# Patient Record
Sex: Male | Born: 2000 | Race: White | Hispanic: No | Marital: Single | Smoking: Never smoker
Health system: Southern US, Community
[De-identification: ages and names within clinical notes are randomized; demographics above are authoritative.]

## PROBLEM LIST (undated history)

## (undated) DIAGNOSIS — J45909 Unspecified asthma, uncomplicated: Secondary | ICD-10-CM

## (undated) HISTORY — DX: Unspecified asthma, uncomplicated: J45.909

---
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
## 2022-01-17 DIAGNOSIS — M9905 Segmental and somatic dysfunction of pelvic region: Secondary | ICD-10-CM | POA: Diagnosis not present

## 2022-01-17 DIAGNOSIS — M9904 Segmental and somatic dysfunction of sacral region: Secondary | ICD-10-CM | POA: Diagnosis not present

## 2022-01-17 DIAGNOSIS — M7918 Myalgia, other site: Secondary | ICD-10-CM | POA: Diagnosis not present

## 2022-01-17 DIAGNOSIS — M9903 Segmental and somatic dysfunction of lumbar region: Secondary | ICD-10-CM | POA: Diagnosis not present

## 2022-01-25 ENCOUNTER — Telehealth: Payer: Self-pay

## 2022-01-25 NOTE — Telephone Encounter (Signed)
Last OV 04/08/21. Ok to work in?

## 2022-01-25 NOTE — Telephone Encounter (Signed)
States patient needs to have sports CPE for college by July.  Would like to know if Dr. Yong Channel could work patient in to be seen?

## 2022-01-26 NOTE — Telephone Encounter (Signed)
I have actually done a sports physical on him at last physical last august- if college will accept I could actually use last august physical  If we schedule him for a physical in July- insurance will not cover unless he switched to a new insurance since [employer]

## 2022-01-27 NOTE — Telephone Encounter (Signed)
FYI

## 2022-01-27 NOTE — Telephone Encounter (Signed)
Mom states that since patient is transitioning into [employer], they will not accept last years sports physical.  Needs forms completed for [employer].  States if insurance will not cover, she is ok with paying out of pocket.  Please advise.

## 2022-01-27 NOTE — Telephone Encounter (Signed)
See below regarding July scheduling/insurance.

## 2022-01-27 NOTE — Telephone Encounter (Signed)
Can work into same day slot

## 2022-01-28 NOTE — Telephone Encounter (Signed)
See below

## 2022-02-07 ENCOUNTER — Ambulatory Visit (INDEPENDENT_AMBULATORY_CARE_PROVIDER_SITE_OTHER): Payer: Self-pay | Admitting: Family Medicine

## 2022-02-07 ENCOUNTER — Encounter: Payer: Self-pay | Admitting: Family Medicine

## 2022-02-07 VITALS — BP 110/64 | HR 60 | Temp 98.6°F | Ht 74.0 in | Wt 197.0 lb

## 2022-02-07 DIAGNOSIS — Z114 Encounter for screening for human immunodeficiency virus [HIV]: Secondary | ICD-10-CM | POA: Diagnosis not present

## 2022-02-07 DIAGNOSIS — Z130 Encounter for screening for diseases of the blood and blood-forming organs and certain disorders involving the immune mechanism: Secondary | ICD-10-CM

## 2022-02-07 DIAGNOSIS — Z1159 Encounter for screening for other viral diseases: Secondary | ICD-10-CM | POA: Diagnosis not present

## 2022-02-07 DIAGNOSIS — Z025 Encounter for examination for participation in sport: Secondary | ICD-10-CM

## 2022-02-07 NOTE — Patient Instructions (Addendum)
Team he has had HPV series- please update from [place] with your family about Bexsero (meningitis B vaccination)- you wanted to hold off until chatting with them- not required for college  We did one time lifetime screening Hep C and HIV per national recommendations- you wanted to hold on other bloodwork including cholesterol for now  Please stop by lab before you go  If you have mychart- we will send your results within 3 business days of [place] receiving them.  If you do not have mychart- we will call you about results within 5 business days of [place] receiving them.  *please also note that you will see labs on mychart as soon as they post. I will later go in and write notes on them- will say "notes from Dr. Yong Channel"   Recommended follow up: Return in about 1 year (around 02/08/2023) for physical or sooner if needed.Schedule b4 you leave.

## 2022-02-07 NOTE — Progress Notes (Signed)
  Phone [phone] In person visit   Subjective:   Johnny Hendricks is a 21 y.o. year old very pleasant male patient who presents for/with See problem oriented charting Chief Complaint  Patient presents with   [facility]    [employer] forms   Past Medical History-  Patient Active Problem List   Diagnosis Date Noted   Childhood asthma 04/08/2021    Priority: Low   Medications- reviewed and updated No current outpatient medications on file.   No current facility-administered medications for this visit.     Objective:  BP 110/64   Pulse 60   Temp 98.6 F (37 C)   Ht '6\' 2"'$  (1.88 m)   Wt 197 lb (89.4 kg)   SpO2 98%   BMI 25.29 kg/m  Gen: NAD, resting comfortably CV: RRR no murmurs rubs or gallops Lungs: CTAB no crackles, wheeze, rhonchi Abdomen: soft/nontender/nondistended/normal bowel sounds. No rebound or guarding.  Ext: no edema Skin: warm, dry Neuro: grossly normal, moves all extremities Sports physical items completed- normal msk exam. No hernia    Assessment and Plan   Sports physical today with [employer] form completion -needs sickle cell testing  Also see after visit summary  Recommended follow up: Return in about 1 year (around 02/08/2023) for physical or sooner if needed.Schedule b4 you leave.  Lab/Order associations:   ICD-10-CM   1. Routine sports examination  Z02.5     2. Encounter for hepatitis C screening test for low risk patient  Z11.59 Hepatitis C antibody    3. Screening for HIV (human immunodeficiency virus)  Z11.4 HIV Antibody (routine testing w rflx)    4. Encounter for sickle-cell screening  Z13.0 Sickle cell screen     Return precautions advised.  Garret Reddish, MD

## 2022-02-08 LAB — SICKLE CELL SCREEN: Sickle Solubility Test - HGBRFX: NEGATIVE

## 2022-02-08 LAB — HEPATITIS C ANTIBODY
Hepatitis C Ab: NONREACTIVE
SIGNAL TO CUT-OFF: 0.09 (ref <1.00)

## 2022-02-08 LAB — HIV ANTIBODY (ROUTINE TESTING W REFLEX): HIV 1&2 Ab, 4th Generation: NONREACTIVE

## 2022-02-21 DIAGNOSIS — M9904 Segmental and somatic dysfunction of sacral region: Secondary | ICD-10-CM | POA: Diagnosis not present

## 2022-02-21 DIAGNOSIS — M9903 Segmental and somatic dysfunction of lumbar region: Secondary | ICD-10-CM | POA: Diagnosis not present

## 2022-02-21 DIAGNOSIS — M7918 Myalgia, other site: Secondary | ICD-10-CM | POA: Diagnosis not present

## 2022-02-21 DIAGNOSIS — M9905 Segmental and somatic dysfunction of pelvic region: Secondary | ICD-10-CM | POA: Diagnosis not present

## 2022-04-05 DIAGNOSIS — M7918 Myalgia, other site: Secondary | ICD-10-CM | POA: Diagnosis not present

## 2022-04-05 DIAGNOSIS — M9905 Segmental and somatic dysfunction of pelvic region: Secondary | ICD-10-CM | POA: Diagnosis not present

## 2022-04-05 DIAGNOSIS — M9903 Segmental and somatic dysfunction of lumbar region: Secondary | ICD-10-CM | POA: Diagnosis not present

## 2022-04-05 DIAGNOSIS — M9904 Segmental and somatic dysfunction of sacral region: Secondary | ICD-10-CM | POA: Diagnosis not present

## 2022-04-12 DIAGNOSIS — M9904 Segmental and somatic dysfunction of sacral region: Secondary | ICD-10-CM | POA: Diagnosis not present

## 2022-04-12 DIAGNOSIS — M9905 Segmental and somatic dysfunction of pelvic region: Secondary | ICD-10-CM | POA: Diagnosis not present

## 2022-04-12 DIAGNOSIS — M7918 Myalgia, other site: Secondary | ICD-10-CM | POA: Diagnosis not present

## 2022-04-12 DIAGNOSIS — M9903 Segmental and somatic dysfunction of lumbar region: Secondary | ICD-10-CM | POA: Diagnosis not present

## 2022-04-19 DIAGNOSIS — M9904 Segmental and somatic dysfunction of sacral region: Secondary | ICD-10-CM | POA: Diagnosis not present

## 2022-04-19 DIAGNOSIS — M7918 Myalgia, other site: Secondary | ICD-10-CM | POA: Diagnosis not present

## 2022-04-19 DIAGNOSIS — M9905 Segmental and somatic dysfunction of pelvic region: Secondary | ICD-10-CM | POA: Diagnosis not present

## 2022-04-19 DIAGNOSIS — M9903 Segmental and somatic dysfunction of lumbar region: Secondary | ICD-10-CM | POA: Diagnosis not present

## 2022-05-17 DIAGNOSIS — M9902 Segmental and somatic dysfunction of thoracic region: Secondary | ICD-10-CM | POA: Diagnosis not present

## 2022-05-17 DIAGNOSIS — M9903 Segmental and somatic dysfunction of lumbar region: Secondary | ICD-10-CM | POA: Diagnosis not present

## 2022-05-17 DIAGNOSIS — M9901 Segmental and somatic dysfunction of cervical region: Secondary | ICD-10-CM | POA: Diagnosis not present

## 2022-05-31 DIAGNOSIS — M9903 Segmental and somatic dysfunction of lumbar region: Secondary | ICD-10-CM | POA: Diagnosis not present

## 2022-05-31 DIAGNOSIS — M9902 Segmental and somatic dysfunction of thoracic region: Secondary | ICD-10-CM | POA: Diagnosis not present

## 2022-05-31 DIAGNOSIS — M9901 Segmental and somatic dysfunction of cervical region: Secondary | ICD-10-CM | POA: Diagnosis not present

## 2022-06-06 DIAGNOSIS — L700 Acne vulgaris: Secondary | ICD-10-CM | POA: Diagnosis not present

## 2022-06-06 DIAGNOSIS — M9903 Segmental and somatic dysfunction of lumbar region: Secondary | ICD-10-CM | POA: Diagnosis not present

## 2022-06-06 DIAGNOSIS — M9904 Segmental and somatic dysfunction of sacral region: Secondary | ICD-10-CM | POA: Diagnosis not present

## 2022-06-06 DIAGNOSIS — M7918 Myalgia, other site: Secondary | ICD-10-CM | POA: Diagnosis not present

## 2022-06-06 DIAGNOSIS — M9905 Segmental and somatic dysfunction of pelvic region: Secondary | ICD-10-CM | POA: Diagnosis not present

## 2022-06-06 DIAGNOSIS — L739 Follicular disorder, unspecified: Secondary | ICD-10-CM | POA: Diagnosis not present

## 2022-06-14 DIAGNOSIS — M9903 Segmental and somatic dysfunction of lumbar region: Secondary | ICD-10-CM | POA: Diagnosis not present

## 2022-06-14 DIAGNOSIS — M9901 Segmental and somatic dysfunction of cervical region: Secondary | ICD-10-CM | POA: Diagnosis not present

## 2022-06-14 DIAGNOSIS — M9902 Segmental and somatic dysfunction of thoracic region: Secondary | ICD-10-CM | POA: Diagnosis not present

## 2022-06-21 DIAGNOSIS — M9901 Segmental and somatic dysfunction of cervical region: Secondary | ICD-10-CM | POA: Diagnosis not present

## 2022-06-21 DIAGNOSIS — M9902 Segmental and somatic dysfunction of thoracic region: Secondary | ICD-10-CM | POA: Diagnosis not present

## 2022-06-21 DIAGNOSIS — M9903 Segmental and somatic dysfunction of lumbar region: Secondary | ICD-10-CM | POA: Diagnosis not present

## 2022-07-05 DIAGNOSIS — M9903 Segmental and somatic dysfunction of lumbar region: Secondary | ICD-10-CM | POA: Diagnosis not present

## 2022-07-05 DIAGNOSIS — M9901 Segmental and somatic dysfunction of cervical region: Secondary | ICD-10-CM | POA: Diagnosis not present

## 2022-07-05 DIAGNOSIS — M9902 Segmental and somatic dysfunction of thoracic region: Secondary | ICD-10-CM | POA: Diagnosis not present

## 2022-07-20 DIAGNOSIS — M9905 Segmental and somatic dysfunction of pelvic region: Secondary | ICD-10-CM | POA: Diagnosis not present

## 2022-07-20 DIAGNOSIS — M7918 Myalgia, other site: Secondary | ICD-10-CM | POA: Diagnosis not present

## 2022-07-20 DIAGNOSIS — M9904 Segmental and somatic dysfunction of sacral region: Secondary | ICD-10-CM | POA: Diagnosis not present

## 2022-07-20 DIAGNOSIS — M9903 Segmental and somatic dysfunction of lumbar region: Secondary | ICD-10-CM | POA: Diagnosis not present

## 2022-08-16 DIAGNOSIS — M9903 Segmental and somatic dysfunction of lumbar region: Secondary | ICD-10-CM | POA: Diagnosis not present

## 2022-08-16 DIAGNOSIS — M9905 Segmental and somatic dysfunction of pelvic region: Secondary | ICD-10-CM | POA: Diagnosis not present

## 2022-08-16 DIAGNOSIS — M7918 Myalgia, other site: Secondary | ICD-10-CM | POA: Diagnosis not present

## 2022-08-16 DIAGNOSIS — M9904 Segmental and somatic dysfunction of sacral region: Secondary | ICD-10-CM | POA: Diagnosis not present

## 2022-08-26 DIAGNOSIS — M9905 Segmental and somatic dysfunction of pelvic region: Secondary | ICD-10-CM | POA: Diagnosis not present

## 2022-08-26 DIAGNOSIS — M9903 Segmental and somatic dysfunction of lumbar region: Secondary | ICD-10-CM | POA: Diagnosis not present

## 2022-08-26 DIAGNOSIS — M9904 Segmental and somatic dysfunction of sacral region: Secondary | ICD-10-CM | POA: Diagnosis not present

## 2022-08-26 DIAGNOSIS — M7918 Myalgia, other site: Secondary | ICD-10-CM | POA: Diagnosis not present

## 2022-08-30 ENCOUNTER — Telehealth: Payer: Self-pay | Admitting: Family Medicine

## 2022-08-30 NOTE — Telephone Encounter (Signed)
Noted  

## 2022-08-30 NOTE — Telephone Encounter (Signed)
Patient states: -Has been having left testicular pain for a few days  - Pain comes and goes but never increased in terms of intensity  - Pain rated 3-4/10 - Feels like a lump may be forming in area   Patient has been transferred to triage.

## 2022-08-30 NOTE — Telephone Encounter (Signed)
Patient has an apt with Dr Randol Kern 08/31/22 at 920 am    patient Name: Johnny Saxon PA [facility] Gender: Male DOB: 01-19-2001 Age: 22 Y 47 M 25 D Return Phone Number: [phone] (Primary), [phone] (Secondary) Address: City/ State/ Zip: [place]  [address] Client [place] [facility] Client Site [place] [facility] Provider Garret Reddish- MD Contact Type Call Who Is Calling Patient / Member / Family / Caregiver Call Type Triage / Clinical Relationship To Patient Self Return Phone Number [phone] (Primary) Chief Complaint Skin Lesion - Moles/ Lumps/ Growths Reason for Call Symptomatic / Request for [name] Nurse Assessment Nurse: Alvis Lemmings, RN, Marcie Bal Date/Time Eilene Ghazi Time): 08/30/2022 9:37:26 AM Confirm and document reason for call. If symptomatic, describe symptoms. ---Caller states he is experiencing testicle pain on his left side and believe a lump is developing. Aching on and off, top of left testicle,felt a small lump on top. Tender to touch. No fever. Does the patient have any new or worsening symptoms? ---Yes Will a triage be completed? ---Yes Related visit to physician within the last 2 weeks? ---No Does the PT have any chronic conditions? (i.e. diabetes, asthma, this includes High risk factors for pregnancy, etc.) ---No Is this a behavioral health or substance abuse call? ---No Guidelines Guideline Title Affirmed Question Affirmed Notes Nurse Date/Time (Eastern Time) Scrotum Pain [1] Pain comes and goes (intermittent) AND [2] present > 24 hours Etter Sjogren 08/30/2022 9:39:08 AM Disp. Time Eilene Ghazi Time) Disposition Final User 08/30/2022 9:40:44 AM See PCP within 24 Hours Yes Alvis Lemmings, RN, Marcie Bal PLEASE NOTE: All timestamps contained within this report  are represented as [place] Standard Time. CONFIDENTIALTY NOTICE: This fax transmission is intended only for the addressee. It contains information that is legally privileged, confidential or otherwise protected from use or disclosure. If you are not the intended recipient, you are strictly prohibited from reviewing, disclosing, copying using or disseminating any of this information or taking any action in reliance on or regarding this information. If you have received this fax in error, please notify us immediately by telephone so that we can arrange for its return to [place]. Phone: [phone], Toll-Free: [phone], Fax: [phone] Page: 2 of 2 Call Id: [id number] Final Disposition 08/30/2022 9:40:44 AM See PCP within 24 Hours Yes Alvis Lemmings, RN, Lenon Oms Disagree/Comply Comply Caller Understands Yes PreDisposition Call Doctor Care Advice Given Per Guideline SEE PCP WITHIN 24 HOURS: * IF OFFICE WILL BE OPEN: You need to be examined within the next 24 hours. Call your doctor (or NP/PA) when the office opens and make an appointment. PAIN MEDICINES: * For pain relief, you can take either acetaminophen, ibuprofen, or naproxen. * They are over-the-counter (OTC) pain drugs. You can buy them at the drugstore. * ACETAMINOPHEN - REGULAR STRENGTH TYLENOL: Take 650 mg (two 325 mg pills) by mouth every 4 to 6 hours as needed. Each Regular Strength Tylenol pill has 325 mg of acetaminophen. The most you should take is 10 pills a day (3,250 mg total). Note: In [place], the maximum is 12 pills a day (3,900 mg total). CARE ADVICE given per Scrotum Pain (Adult) guideline. CALL BACK IF: * Severe pain * Constant pain lasts over 1 hour * Swelling or redness occurs * Fever over 100.4 F (38.0 C) * You become worse Referrals REFERRED TO PCP OFFICE Warm transfer to  backline

## 2022-08-31 ENCOUNTER — Ambulatory Visit (INDEPENDENT_AMBULATORY_CARE_PROVIDER_SITE_OTHER): Payer: BC Managed Care – PPO | Admitting: Internal Medicine

## 2022-08-31 ENCOUNTER — Other Ambulatory Visit (HOSPITAL_COMMUNITY)
Admission: RE | Admit: 2022-08-31 | Discharge: 2022-08-31 | Disposition: A | Discharge status: Home / Self Care | Payer: BC Managed Care – PPO | Source: Ambulatory Visit | Attending: Internal Medicine | Admitting: Internal Medicine

## 2022-08-31 ENCOUNTER — Other Ambulatory Visit: Payer: Self-pay

## 2022-08-31 ENCOUNTER — Encounter: Payer: Self-pay | Admitting: Internal Medicine

## 2022-08-31 VITALS — BP 104/65 | HR 66 | Temp 97.8°F | Ht 74.07 in | Wt 200.2 lb

## 2022-08-31 DIAGNOSIS — N5089 Other specified disorders of the male genital organs: Secondary | ICD-10-CM | POA: Insufficient documentation

## 2022-08-31 DIAGNOSIS — N451 Epididymitis: Secondary | ICD-10-CM | POA: Diagnosis not present

## 2022-08-31 NOTE — Addendum Note (Signed)
Addended by: Joanette Gula on: 08/31/2022 10:41 AM   Modules accepted: Orders

## 2022-08-31 NOTE — Addendum Note (Signed)
Addended by: Doran Clay A on: 08/31/2022 10:30 AM   Modules accepted: Orders

## 2022-08-31 NOTE — Assessment & Plan Note (Addendum)
We entered in the information above and also prompted [name] artificial intelligence to analyze the case and it for the most part agreed with my personal clinical impression that this seems most likely to be epididymitis but testicular cancer does need ruled out.  Based on his reports it seems unlikely to be a sexually transmitted epididymitis.  Where he does extreme lifting its most likely to be some sort of traumatic epididymitis from trauma secondary to the lifting.   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 Approximately 10 days ago, they noticed discomfort and a slightly painful lump in the posterior superior region of the left testicle, which was first observed around August 17, 2022. The individual denies any recent sexual activity and has consistently used protection, making a sexually transmitted disease (STD) unlikely. Tenderness persists in the area, and there is also a report of slight pain in the inguinal region. There are no symptoms of dysuria, urinary frequency, or abnormalities in urine or semen. The patient's medication list, allergies, social history, physical examination findings, laboratory data, illness course, and imaging data are not provided in the summary.  Most Likely Dx:  Epididymitis is the most likely diagnosis. This condition is characterized by inflammation of the epididymis, which can cause pain, swelling, and tenderness in the scrotal area. The patient's symptoms of discomfort, a palpable lump, and tenderness in the testicular region, along with pain in the inguinal area, are consistent with this diagnosis. The presence of fever, pyuria, or a positive urine  culture could further suggest this diagnosis.  Expanded DDx:  Testicular Torsion: Testicular torsion could explain the acute onset of pain and swelling in the testicular region. This condition is a urological emergency and requires immediate surgical intervention. The presence of an absent cremasteric reflex or abnormal testicular lie could further suggest this diagnosis.  Varicocele: A varicocele could explain the lump and discomfort in the testicular region, particularly if the pain increases with physical activity or standing, as it is often associated with dilated veins in the spermatic cord. The presence of a "bag of worms" texture upon palpation of the spermatic cord could further suggest this diagnosis.  Alternative DDx:  Spermatocele: A spermatocele could explain the presence of a lump in the testicular region. It is a benign cystic accumulation of sperm that arises from the head of the epididymis. The presence of a transilluminating mass upon scrotal examination could further suggest this diagnosis.  Hydrocele: A hydrocele could explain the swelling and discomfort in the testicular region. It is characterized by fluid accumulation within the tunica vaginalis. The presence of a positive transillumination test could further suggest this diagnosis.  Inguinal Hernia: An inguinal hernia could explain the pain in the inguinal region and could potentially present as a lump near the testicle if part of the intestine or fatty tissue protrudes into the inguinal canal. The presence of a cough impulse or a lump that enlarges with straining could further suggest this diagnosis.  Orchitis: Orchitis, or inflammation of the testicle, could explain the testicular pain and swelling. It can occur in conjunction with epididymitis (epididymo-orchitis) or independently. The presence of systemic symptoms such as fever could further suggest this diagnosis.  Testicular Cancer: Although  less likely given the  patient's age and the acute presentation, testicular cancer could present as a lump in the testicle. The presence of a firm, non-tender mass that does not transilluminate could further suggest this diagnosis.  Trauma: Given the patient's history as an athlete involved in heavy lifting, direct trauma to the testicular region could explain the pain and swelling. The presence of bruising or a history of a specific traumatic event could further suggest this diagnosis.  #Testicular Mass  The 22 year old patient presents with a chief complaint of a slightly painful lump on the posterior superior aspect of the left testicle, first noticed approximately 10 days ago. The discomfort and pain in the region, along with the absence of sexual activity for months and consistent use of protection, make a sexually transmitted disease less likely. 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 The differential diagnosis includes testicular torsion, epididymitis, varicocele, hydrocele, spermatocele, and testicular cancer.  Dx: - Testicular ultrasound to assess for masses, blood flow, and structural abnormalities. - Urinalysis to rule out infection or urinary tract involvement. - Complete blood count (CBC) and inflammatory markers such as C-reactive protein (CRP) or erythrocyte sedimentation rate (ESR) to evaluate for signs of infection or inflammation. - Tumor markers (alpha-fetoprotein (AFP), human chorionic gonadotropin (hCG), and lactate dehydrogenase (LDH)) if a testicular tumor is suspected.  Tx: - For testicular torsion, immediate urological consultation for potential surgical intervention. - For epididymitis, antibiotics tailored to the likely causative organisms. - For varicocele, conservative management with scrotal support or surgical repair if symptomatic. - For hydrocele,  observation or surgical repair if symptomatic or large. - For spermatocele, conservative management or surgical excision if symptomatic. - For testicular cancer, referral to a urologist and oncologist for staging and management, which may include orchiectomy, chemotherapy, and/or radiation therapy depending on the stage and type of cancer.  I reviewed all the above with patient and advised he complete the std testing despite the low risk and the stat ultrasound and arranged that with our refer coordinator.  I advised him not to do anything to strain the area (no exercise) until we get this done.   Decided against a lot of the artificial intelligence suggested blood work that I felt would not impact management yet- I feel like we need to confirm what the lump is with ultrasound and sexual transmitted infection and UA initially and asked him to remain available until that is all done.  I shared this encounter  back to his Primary Care Provider (PCP) to coordinate care.

## 2022-08-31 NOTE — Addendum Note (Signed)
Addended by: Joanette Gula on: 08/31/2022 10:27 AM   Modules accepted: Orders

## 2022-08-31 NOTE — Telephone Encounter (Signed)
Actually, that was another patient. Disregard that.

## 2022-08-31 NOTE — Progress Notes (Signed)
[facility]: [phone]   Routine Medical Office Visit  Patient:  Johnny Hendricks      Age: 22 y.o.       Sex:  male  Date:   08/31/2022  PCP:    Johnny Hendricks, [place] Provider: Loralee Pacas, MD  Assessment/Plan:   Johnny Hendricks was seen today for left testicle pain/lump.  Epididymitis -     Cytology (oral, anal, urethral) ancillary only -     RPR -     HIV Antibody (routine testing w rflx); Future -     Herpes simplex virus culture -     US PELVIS (TRANSABDOMINAL ONLY); Future  Testicle lump Overview: He reports he noticed some swelling and a lump on his left testicle about August 17, 2022 after investigating it for discomfort and pain in the region.  He had not had any sex for months prior and wears protection when he does so feels the STD very unlikely.  He still has tenderness in the area.  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  Denies any dysuria urinary frequency abnormalities of the urine or semen.  On physical exam his cremasteric reflex is intact and there is a swelling with amorphous soft irregularity on the superior posterior testicle there is no abnormality of the skin in the area  Assessment & Plan: We entered in the information above and also prompted Johnny Hendricks artificial intelligence to analyze the case and it for the most part agreed with my personal clinical impression that this seems most likely to be epididymitis but testicular cancer does need ruled out and based on his reports it seems unlikely to be a sexually transmitted epididymitis.  Where he does extreme lifting I have to also be concern for epididymitis from trauma secondary to the lifting.   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 Approximately 10 days ago, they noticed discomfort and a slightly painful lump in the posterior superior region of the left testicle, which was first observed around August 17, 2022. The individual denies any recent sexual activity and has consistently used protection, making a sexually transmitted disease (STD) unlikely. Tenderness persists in the area, and there is also a report of slight pain in the inguinal region. There are no symptoms of dysuria, urinary frequency, or abnormalities in urine or semen. The patient's medication list, allergies, social history, physical examination findings, laboratory data, illness course, and imaging data are not provided in the summary.  Most Likely Dx:  Epididymitis is the most likely diagnosis. This condition is characterized by inflammation of the epididymis, which can cause pain, swelling, and tenderness in the scrotal area. The patient's symptoms of discomfort, a palpable lump, and tenderness in the testicular region, along with pain in the inguinal area, are consistent with this diagnosis. The presence of fever, pyuria, or a positive urine culture could further suggest this diagnosis.  Expanded DDx:  Testicular Torsion: Testicular torsion could explain the acute onset of pain and swelling in the testicular region. This condition is a urological emergency and requires immediate surgical intervention. The presence  of an absent cremasteric reflex or abnormal testicular lie could further suggest this diagnosis.  Varicocele: A varicocele could explain the lump and discomfort in the testicular region, particularly if the pain increases with physical activity or standing, as it is often associated with dilated veins in the spermatic cord. The presence of a "bag of worms" texture upon palpation of the  spermatic cord could further suggest this diagnosis.  Alternative DDx:  Spermatocele: A spermatocele could explain the presence of a lump in the testicular region. It is a benign cystic accumulation of sperm that arises from the head of the epididymis. The presence of a transilluminating mass upon scrotal examination could further suggest this diagnosis.  Hydrocele: A hydrocele could explain the swelling and discomfort in the testicular region. It is characterized by fluid accumulation within the tunica vaginalis. The presence of a positive transillumination test could further suggest this diagnosis.  Inguinal Hernia: An inguinal hernia could explain the pain in the inguinal region and could potentially present as a lump near the testicle if part of the intestine or fatty tissue protrudes into the inguinal canal. The presence of a cough impulse or a lump that enlarges with straining could further suggest this diagnosis.  Orchitis: Orchitis, or inflammation of the testicle, could explain the testicular pain and swelling. It can occur in conjunction with epididymitis (epididymo-orchitis) or independently. The presence of systemic symptoms such as fever could further suggest this diagnosis.  Testicular Cancer: Although less likely given the patient's age and the acute presentation, testicular cancer could present as a lump in the testicle. The presence of a firm, non-tender mass that does not transilluminate could further suggest this diagnosis.  Trauma: Given the patient's history as an athlete involved in heavy lifting, direct trauma to the testicular region could explain the pain and swelling. The presence of bruising or a history of a specific traumatic event could further suggest this diagnosis.  #Testicular Mass  The 22 year old patient presents with a chief complaint of a slightly painful lump on the posterior superior aspect of the left testicle, first noticed approximately 10 days ago. The  discomfort and pain in the region, along with the absence of sexual activity for months and consistent use of protection, make a sexually transmitted disease less likely. 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 The differential diagnosis includes testicular torsion, epididymitis, varicocele, hydrocele, spermatocele, and testicular cancer.  Dx: - Testicular ultrasound to assess for masses, blood flow, and structural abnormalities. - Urinalysis to rule out infection or urinary tract involvement. - Complete blood count (CBC) and inflammatory markers such as C-reactive protein (CRP) or erythrocyte sedimentation rate (ESR) to evaluate for signs of infection or inflammation. - Tumor markers (alpha-fetoprotein (AFP), human chorionic gonadotropin (hCG), and lactate dehydrogenase (LDH)) if a testicular tumor is suspected.  Tx: - For testicular torsion, immediate urological consultation for potential surgical intervention. - For epididymitis, antibiotics tailored to the likely causative organisms. - For varicocele, conservative management with scrotal support or surgical repair if symptomatic. - For hydrocele, observation or surgical repair if symptomatic or large. - For spermatocele, conservative management or surgical excision if symptomatic. - For testicular cancer, referral to a urologist and oncologist for staging and management, which may include orchiectomy, chemotherapy, and/or radiation therapy depending on the stage and type of cancer.  I reviewed all the above with patient and advised he complete  the std testing despite the low risk and the stat ultrasound and arranged that with our refer coordinator.  I advised him not to do anything to strain the area (no exercise) until we get this done.   Decided against a lot of the artificial intelligence suggested  blood work that I felt would not impact management yet- I feel like we need to confirm what the lump is with ultrasound and sexual transmitted infection and UA initially and asked him to remain available until that is all done.   Orders: -     US PELVIS (TRANSABDOMINAL ONLY); Future      Subjective:   Garison Genova is a 22 y.o. male with the following chart data reviewed: Past Medical History:  Diagnosis Date   Asthma    Testicle lump 08/31/2022   Outpatient Medications Prior to Visit  Medication Sig   albuterol (PROAIR HFA) 108 (90 Base) MCG/ACT inhaler 0 Refill(s), Type: Soft Stop   No facility-administered medications prior to visit.     He presented today reporting reason for visit as: Chief Complaint  Patient presents with   Left testicle pain/lump    Achy pain/discomfort -for about a week, intermittent. Feels as if a lump might be there.     Problem-focused charting was used to record today's medical interview as problems and problem overview medical record updates as follows: Problem  Testicle Lump   He reports he noticed some swelling and a lump on his left testicle about August 17, 2022 after investigating it for discomfort and pain in the region.  He had not had any sex for months prior and wears protection when he does so feels the STD very unlikely.  He still has tenderness in the area.  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  Denies any dysuria urinary frequency abnormalities of the urine or semen.  On physical exam his cremasteric reflex is intact and there is a swelling with amorphous soft irregularity on the superior posterior testicle there is no abnormality of the skin in the area      1 undiagnosed new problem with uncertain prognosis was addressed with extensive testing and lab evaluation        Objective:  Physical Exam  BP 104/65 (BP Location: Left Arm, Patient  Position: Sitting)   Pulse 66   Temp 97.8 F (36.6 C) (Temporal)   Ht 6' 2.07" (1.881 m)   Wt 200 lb 3.2 oz (90.8 kg)   SpO2 98%   BMI 25.66 kg/m  Wt Readings from Last 10 Encounters:  08/31/22 200 lb 3.2 oz (90.8 kg) (93 %, Z= 1.44)*  02/07/22 197 lb (89.4 kg) (92 %, Z= 1.42)*  04/08/21 193 lb 12.8 oz (87.9 kg) (92 %, Z= 1.43)*   * Growth percentiles are based on CDC (Boys, 2-20 Years) data.   Today's vital signs reviewed.  Nursing notes reviewed. Weight trend reviewed. General Appearance/Constitutional:   Overweight male according to BMI standardized data, but waist circumference data should be used instead because BMI-based assessments do not account for lean muscle mass or the fact that only adipose tissue around the waist is unhealthy. He is very muscular.  He is in no acute distress. He can left the left testicle with no grimace or obvious discomfot Musculoskeletal: All extremities are intact.  Neurological:  Awake, alert,  No obvious focal neurological deficits or cognitive impairments Psychiatric:  Appropriate  mood, pleasant demeanor Problem-specific findings:  intact cremasteric rfx, lump superior posterior testicle slightly tender with otherwise nl groin anatomy    Johnny Pacas, MD

## 2022-08-31 NOTE — Addendum Note (Signed)
Addended by: Doran Clay A on: 08/31/2022 10:35 AM   Modules accepted: Orders

## 2022-08-31 NOTE — Telephone Encounter (Signed)
I do not see that his result has been completed yet- appears to be scheduled- happy to help if Dr. Randol Kern wants to chat after results back

## 2022-09-01 ENCOUNTER — Ambulatory Visit (HOSPITAL_BASED_OUTPATIENT_CLINIC_OR_DEPARTMENT_OTHER)
Admission: RE | Admit: 2022-09-01 | Discharge: 2022-09-01 | Disposition: A | Discharge status: Home / Self Care | Payer: BC Managed Care – PPO | Source: Ambulatory Visit | Attending: Internal Medicine | Admitting: Internal Medicine

## 2022-09-01 ENCOUNTER — Telehealth: Payer: Self-pay | Admitting: Family Medicine

## 2022-09-01 DIAGNOSIS — N451 Epididymitis: Secondary | ICD-10-CM | POA: Diagnosis not present

## 2022-09-01 DIAGNOSIS — N5089 Other specified disorders of the male genital organs: Secondary | ICD-10-CM | POA: Diagnosis not present

## 2022-09-01 DIAGNOSIS — N50819 Testicular pain, unspecified: Secondary | ICD-10-CM | POA: Diagnosis not present

## 2022-09-01 LAB — URINE CYTOLOGY ANCILLARY ONLY
Chlamydia: NEGATIVE
Comment: NEGATIVE
Comment: NEGATIVE
Comment: NORMAL
Neisseria Gonorrhea: NEGATIVE
Trichomonas: NEGATIVE

## 2022-09-01 LAB — URINALYSIS, COMPLETE
Bacteria, UA: NONE SEEN /HPF
Bilirubin Urine: NEGATIVE
Glucose, UA: NEGATIVE
Hgb urine dipstick: NEGATIVE
Hyaline Cast: NONE SEEN /LPF
Ketones, ur: NEGATIVE
Leukocytes,Ua: NEGATIVE
Nitrite: NEGATIVE
Protein, ur: NEGATIVE
RBC / HPF: NONE SEEN /HPF (ref 0–2)
Specific Gravity, Urine: 1.022 (ref 1.001–1.035)
Squamous Epithelial / HPF: NONE SEEN /HPF (ref <=5)
WBC, UA: NONE SEEN /HPF (ref 0–5)
pH: 5.5 (ref 5.0–8.0)

## 2022-09-01 LAB — HIV ANTIBODY (ROUTINE TESTING W REFLEX): HIV 1&2 Ab, 4th Generation: NONREACTIVE

## 2022-09-01 NOTE — Telephone Encounter (Signed)
Caller states: -They need to confirm if patient's [place] of scrotum needs to be with or with out doppler  - Pt has [place] scheduled for today @ 10am - Can either call with this info at [phone] or secure chat @ [facility]

## 2022-09-01 NOTE — Progress Notes (Signed)
Urine showed no signs & symptoms of infection let him know.  It was totally clear. If the pain is getting worse I want him to go to ER immediately rather than wait for the ultrasound due to possibility of testicular torsion.

## 2022-09-02 DIAGNOSIS — M9905 Segmental and somatic dysfunction of pelvic region: Secondary | ICD-10-CM | POA: Diagnosis not present

## 2022-09-02 DIAGNOSIS — M9903 Segmental and somatic dysfunction of lumbar region: Secondary | ICD-10-CM | POA: Diagnosis not present

## 2022-09-02 DIAGNOSIS — M9904 Segmental and somatic dysfunction of sacral region: Secondary | ICD-10-CM | POA: Diagnosis not present

## 2022-09-02 DIAGNOSIS — M7918 Myalgia, other site: Secondary | ICD-10-CM | POA: Diagnosis not present

## 2022-09-02 LAB — HERPES SIMPLEX VIRUS CULTURE

## 2022-09-05 ENCOUNTER — Encounter: Payer: Self-pay | Admitting: Internal Medicine

## 2022-09-06 LAB — HERPES SIMPLEX VIRUS CULTURE

## 2022-09-06 LAB — RPR: RPR Ser Ql: NONREACTIVE

## 2022-09-07 NOTE — Progress Notes (Signed)
All lab testing negative please notify

## 2022-09-09 DIAGNOSIS — M9904 Segmental and somatic dysfunction of sacral region: Secondary | ICD-10-CM | POA: Diagnosis not present

## 2022-09-09 DIAGNOSIS — M7918 Myalgia, other site: Secondary | ICD-10-CM | POA: Diagnosis not present

## 2022-09-09 DIAGNOSIS — M9905 Segmental and somatic dysfunction of pelvic region: Secondary | ICD-10-CM | POA: Diagnosis not present

## 2022-09-09 DIAGNOSIS — M9903 Segmental and somatic dysfunction of lumbar region: Secondary | ICD-10-CM | POA: Diagnosis not present

## 2022-10-04 DIAGNOSIS — M9907 Segmental and somatic dysfunction of upper extremity: Secondary | ICD-10-CM | POA: Diagnosis not present

## 2022-10-04 DIAGNOSIS — M9902 Segmental and somatic dysfunction of thoracic region: Secondary | ICD-10-CM | POA: Diagnosis not present

## 2022-10-04 DIAGNOSIS — M9901 Segmental and somatic dysfunction of cervical region: Secondary | ICD-10-CM | POA: Diagnosis not present

## 2022-11-07 DIAGNOSIS — M9907 Segmental and somatic dysfunction of upper extremity: Secondary | ICD-10-CM | POA: Diagnosis not present

## 2022-11-07 DIAGNOSIS — M9901 Segmental and somatic dysfunction of cervical region: Secondary | ICD-10-CM | POA: Diagnosis not present

## 2022-11-07 DIAGNOSIS — M9902 Segmental and somatic dysfunction of thoracic region: Secondary | ICD-10-CM | POA: Diagnosis not present

## 2022-11-29 DIAGNOSIS — M9906 Segmental and somatic dysfunction of lower extremity: Secondary | ICD-10-CM | POA: Diagnosis not present

## 2022-11-29 DIAGNOSIS — M9902 Segmental and somatic dysfunction of thoracic region: Secondary | ICD-10-CM | POA: Diagnosis not present

## 2022-11-29 DIAGNOSIS — M9901 Segmental and somatic dysfunction of cervical region: Secondary | ICD-10-CM | POA: Diagnosis not present

## 2022-11-29 DIAGNOSIS — M9903 Segmental and somatic dysfunction of lumbar region: Secondary | ICD-10-CM | POA: Diagnosis not present

## 2022-12-13 DIAGNOSIS — M9906 Segmental and somatic dysfunction of lower extremity: Secondary | ICD-10-CM | POA: Diagnosis not present

## 2022-12-13 DIAGNOSIS — M9903 Segmental and somatic dysfunction of lumbar region: Secondary | ICD-10-CM | POA: Diagnosis not present

## 2022-12-13 DIAGNOSIS — M9902 Segmental and somatic dysfunction of thoracic region: Secondary | ICD-10-CM | POA: Diagnosis not present

## 2022-12-13 DIAGNOSIS — M9901 Segmental and somatic dysfunction of cervical region: Secondary | ICD-10-CM | POA: Diagnosis not present

## 2022-12-26 DIAGNOSIS — M9902 Segmental and somatic dysfunction of thoracic region: Secondary | ICD-10-CM | POA: Diagnosis not present

## 2022-12-26 DIAGNOSIS — M9903 Segmental and somatic dysfunction of lumbar region: Secondary | ICD-10-CM | POA: Diagnosis not present

## 2022-12-26 DIAGNOSIS — M9906 Segmental and somatic dysfunction of lower extremity: Secondary | ICD-10-CM | POA: Diagnosis not present

## 2022-12-26 DIAGNOSIS — M9901 Segmental and somatic dysfunction of cervical region: Secondary | ICD-10-CM | POA: Diagnosis not present

## 2023-01-09 DIAGNOSIS — M9903 Segmental and somatic dysfunction of lumbar region: Secondary | ICD-10-CM | POA: Diagnosis not present

## 2023-01-09 DIAGNOSIS — M9902 Segmental and somatic dysfunction of thoracic region: Secondary | ICD-10-CM | POA: Diagnosis not present

## 2023-01-09 DIAGNOSIS — M9906 Segmental and somatic dysfunction of lower extremity: Secondary | ICD-10-CM | POA: Diagnosis not present

## 2023-01-09 DIAGNOSIS — M9901 Segmental and somatic dysfunction of cervical region: Secondary | ICD-10-CM | POA: Diagnosis not present

## 2023-01-09 DIAGNOSIS — M9907 Segmental and somatic dysfunction of upper extremity: Secondary | ICD-10-CM | POA: Diagnosis not present

## 2023-01-18 DIAGNOSIS — M7918 Myalgia, other site: Secondary | ICD-10-CM | POA: Diagnosis not present

## 2023-01-18 DIAGNOSIS — M9904 Segmental and somatic dysfunction of sacral region: Secondary | ICD-10-CM | POA: Diagnosis not present

## 2023-01-18 DIAGNOSIS — M9903 Segmental and somatic dysfunction of lumbar region: Secondary | ICD-10-CM | POA: Diagnosis not present

## 2023-01-18 DIAGNOSIS — M9905 Segmental and somatic dysfunction of pelvic region: Secondary | ICD-10-CM | POA: Diagnosis not present

## 2023-01-27 DIAGNOSIS — N50812 Left testicular pain: Secondary | ICD-10-CM | POA: Diagnosis not present

## 2023-02-06 DIAGNOSIS — M9905 Segmental and somatic dysfunction of pelvic region: Secondary | ICD-10-CM | POA: Diagnosis not present

## 2023-02-06 DIAGNOSIS — M9903 Segmental and somatic dysfunction of lumbar region: Secondary | ICD-10-CM | POA: Diagnosis not present

## 2023-02-06 DIAGNOSIS — M7918 Myalgia, other site: Secondary | ICD-10-CM | POA: Diagnosis not present

## 2023-02-06 DIAGNOSIS — M9904 Segmental and somatic dysfunction of sacral region: Secondary | ICD-10-CM | POA: Diagnosis not present

## 2023-02-09 ENCOUNTER — Encounter: Payer: BC Managed Care – PPO | Admitting: Family Medicine

## 2023-02-28 DIAGNOSIS — M9903 Segmental and somatic dysfunction of lumbar region: Secondary | ICD-10-CM | POA: Diagnosis not present

## 2023-02-28 DIAGNOSIS — M9905 Segmental and somatic dysfunction of pelvic region: Secondary | ICD-10-CM | POA: Diagnosis not present

## 2023-02-28 DIAGNOSIS — M7918 Myalgia, other site: Secondary | ICD-10-CM | POA: Diagnosis not present

## 2023-02-28 DIAGNOSIS — M9904 Segmental and somatic dysfunction of sacral region: Secondary | ICD-10-CM | POA: Diagnosis not present

## 2023-03-07 DIAGNOSIS — M9903 Segmental and somatic dysfunction of lumbar region: Secondary | ICD-10-CM | POA: Diagnosis not present

## 2023-03-07 DIAGNOSIS — M9905 Segmental and somatic dysfunction of pelvic region: Secondary | ICD-10-CM | POA: Diagnosis not present

## 2023-03-07 DIAGNOSIS — M9904 Segmental and somatic dysfunction of sacral region: Secondary | ICD-10-CM | POA: Diagnosis not present

## 2023-03-07 DIAGNOSIS — M7918 Myalgia, other site: Secondary | ICD-10-CM | POA: Diagnosis not present

## 2023-04-06 DIAGNOSIS — M7918 Myalgia, other site: Secondary | ICD-10-CM | POA: Diagnosis not present

## 2023-04-06 DIAGNOSIS — L700 Acne vulgaris: Secondary | ICD-10-CM | POA: Diagnosis not present

## 2023-04-06 DIAGNOSIS — M9904 Segmental and somatic dysfunction of sacral region: Secondary | ICD-10-CM | POA: Diagnosis not present

## 2023-04-06 DIAGNOSIS — M9905 Segmental and somatic dysfunction of pelvic region: Secondary | ICD-10-CM | POA: Diagnosis not present

## 2023-04-06 DIAGNOSIS — Z79899 Other long term (current) drug therapy: Secondary | ICD-10-CM | POA: Diagnosis not present

## 2023-04-06 DIAGNOSIS — M9903 Segmental and somatic dysfunction of lumbar region: Secondary | ICD-10-CM | POA: Diagnosis not present

## 2023-04-17 DIAGNOSIS — M9905 Segmental and somatic dysfunction of pelvic region: Secondary | ICD-10-CM | POA: Diagnosis not present

## 2023-04-17 DIAGNOSIS — M7918 Myalgia, other site: Secondary | ICD-10-CM | POA: Diagnosis not present

## 2023-04-17 DIAGNOSIS — M9903 Segmental and somatic dysfunction of lumbar region: Secondary | ICD-10-CM | POA: Diagnosis not present

## 2023-04-17 DIAGNOSIS — M9904 Segmental and somatic dysfunction of sacral region: Secondary | ICD-10-CM | POA: Diagnosis not present

## 2023-04-21 DIAGNOSIS — M9905 Segmental and somatic dysfunction of pelvic region: Secondary | ICD-10-CM | POA: Diagnosis not present

## 2023-04-21 DIAGNOSIS — M9903 Segmental and somatic dysfunction of lumbar region: Secondary | ICD-10-CM | POA: Diagnosis not present

## 2023-04-21 DIAGNOSIS — M9904 Segmental and somatic dysfunction of sacral region: Secondary | ICD-10-CM | POA: Diagnosis not present

## 2023-04-21 DIAGNOSIS — M7918 Myalgia, other site: Secondary | ICD-10-CM | POA: Diagnosis not present

## 2023-05-02 DIAGNOSIS — L700 Acne vulgaris: Secondary | ICD-10-CM | POA: Diagnosis not present

## 2023-05-02 DIAGNOSIS — Z79899 Other long term (current) drug therapy: Secondary | ICD-10-CM | POA: Diagnosis not present

## 2023-05-03 DIAGNOSIS — M9903 Segmental and somatic dysfunction of lumbar region: Secondary | ICD-10-CM | POA: Diagnosis not present

## 2023-05-03 DIAGNOSIS — M9905 Segmental and somatic dysfunction of pelvic region: Secondary | ICD-10-CM | POA: Diagnosis not present

## 2023-05-03 DIAGNOSIS — M9901 Segmental and somatic dysfunction of cervical region: Secondary | ICD-10-CM | POA: Diagnosis not present

## 2023-05-03 DIAGNOSIS — M9902 Segmental and somatic dysfunction of thoracic region: Secondary | ICD-10-CM | POA: Diagnosis not present

## 2023-05-03 DIAGNOSIS — M9906 Segmental and somatic dysfunction of lower extremity: Secondary | ICD-10-CM | POA: Diagnosis not present

## 2023-05-04 DIAGNOSIS — Z79899 Other long term (current) drug therapy: Secondary | ICD-10-CM | POA: Diagnosis not present

## 2023-05-04 DIAGNOSIS — L700 Acne vulgaris: Secondary | ICD-10-CM | POA: Diagnosis not present

## 2023-05-17 DIAGNOSIS — M9903 Segmental and somatic dysfunction of lumbar region: Secondary | ICD-10-CM | POA: Diagnosis not present

## 2023-05-17 DIAGNOSIS — M9901 Segmental and somatic dysfunction of cervical region: Secondary | ICD-10-CM | POA: Diagnosis not present

## 2023-05-17 DIAGNOSIS — M9906 Segmental and somatic dysfunction of lower extremity: Secondary | ICD-10-CM | POA: Diagnosis not present

## 2023-05-17 DIAGNOSIS — M9905 Segmental and somatic dysfunction of pelvic region: Secondary | ICD-10-CM | POA: Diagnosis not present

## 2023-05-17 DIAGNOSIS — M9902 Segmental and somatic dysfunction of thoracic region: Secondary | ICD-10-CM | POA: Diagnosis not present

## 2023-05-29 DIAGNOSIS — M9903 Segmental and somatic dysfunction of lumbar region: Secondary | ICD-10-CM | POA: Diagnosis not present

## 2023-05-29 DIAGNOSIS — M9902 Segmental and somatic dysfunction of thoracic region: Secondary | ICD-10-CM | POA: Diagnosis not present

## 2023-05-29 DIAGNOSIS — M9906 Segmental and somatic dysfunction of lower extremity: Secondary | ICD-10-CM | POA: Diagnosis not present

## 2023-05-29 DIAGNOSIS — M9905 Segmental and somatic dysfunction of pelvic region: Secondary | ICD-10-CM | POA: Diagnosis not present

## 2023-05-29 DIAGNOSIS — M9901 Segmental and somatic dysfunction of cervical region: Secondary | ICD-10-CM | POA: Diagnosis not present

## 2023-05-30 DIAGNOSIS — L700 Acne vulgaris: Secondary | ICD-10-CM | POA: Diagnosis not present

## 2023-05-30 DIAGNOSIS — Z79899 Other long term (current) drug therapy: Secondary | ICD-10-CM | POA: Diagnosis not present

## 2023-06-01 DIAGNOSIS — L700 Acne vulgaris: Secondary | ICD-10-CM | POA: Diagnosis not present

## 2023-06-01 DIAGNOSIS — Z79899 Other long term (current) drug therapy: Secondary | ICD-10-CM | POA: Diagnosis not present

## 2023-06-01 DIAGNOSIS — L720 Epidermal cyst: Secondary | ICD-10-CM | POA: Diagnosis not present

## 2023-06-19 DIAGNOSIS — M9902 Segmental and somatic dysfunction of thoracic region: Secondary | ICD-10-CM | POA: Diagnosis not present

## 2023-06-19 DIAGNOSIS — M9906 Segmental and somatic dysfunction of lower extremity: Secondary | ICD-10-CM | POA: Diagnosis not present

## 2023-06-19 DIAGNOSIS — M9903 Segmental and somatic dysfunction of lumbar region: Secondary | ICD-10-CM | POA: Diagnosis not present

## 2023-06-19 DIAGNOSIS — M9901 Segmental and somatic dysfunction of cervical region: Secondary | ICD-10-CM | POA: Diagnosis not present

## 2023-06-30 ENCOUNTER — Telehealth: Payer: Self-pay

## 2023-06-30 NOTE — Telephone Encounter (Signed)
Called pt and lvm w/ cb number to please call the office and schedule annual CPE with PCP.

## 2023-07-03 DIAGNOSIS — M9906 Segmental and somatic dysfunction of lower extremity: Secondary | ICD-10-CM | POA: Diagnosis not present

## 2023-07-03 DIAGNOSIS — Z79899 Other long term (current) drug therapy: Secondary | ICD-10-CM | POA: Diagnosis not present

## 2023-07-03 DIAGNOSIS — M9902 Segmental and somatic dysfunction of thoracic region: Secondary | ICD-10-CM | POA: Diagnosis not present

## 2023-07-03 DIAGNOSIS — M9901 Segmental and somatic dysfunction of cervical region: Secondary | ICD-10-CM | POA: Diagnosis not present

## 2023-07-03 DIAGNOSIS — L700 Acne vulgaris: Secondary | ICD-10-CM | POA: Diagnosis not present

## 2023-07-03 DIAGNOSIS — M9903 Segmental and somatic dysfunction of lumbar region: Secondary | ICD-10-CM | POA: Diagnosis not present

## 2023-07-25 DIAGNOSIS — M9903 Segmental and somatic dysfunction of lumbar region: Secondary | ICD-10-CM | POA: Diagnosis not present

## 2023-07-25 DIAGNOSIS — M7918 Myalgia, other site: Secondary | ICD-10-CM | POA: Diagnosis not present

## 2023-07-25 DIAGNOSIS — M9905 Segmental and somatic dysfunction of pelvic region: Secondary | ICD-10-CM | POA: Diagnosis not present

## 2023-07-25 DIAGNOSIS — M9904 Segmental and somatic dysfunction of sacral region: Secondary | ICD-10-CM | POA: Diagnosis not present

## 2023-07-28 ENCOUNTER — Ambulatory Visit: Payer: Self-pay

## 2023-07-28 DIAGNOSIS — J4520 Mild intermittent asthma, uncomplicated: Secondary | ICD-10-CM | POA: Diagnosis not present

## 2023-07-28 DIAGNOSIS — H6992 Unspecified Eustachian tube disorder, left ear: Secondary | ICD-10-CM | POA: Diagnosis not present

## 2023-07-28 DIAGNOSIS — R052 Subacute cough: Secondary | ICD-10-CM | POA: Diagnosis not present

## 2023-07-28 DIAGNOSIS — R519 Headache, unspecified: Secondary | ICD-10-CM | POA: Diagnosis not present

## 2023-08-03 DIAGNOSIS — Z79899 Other long term (current) drug therapy: Secondary | ICD-10-CM | POA: Diagnosis not present

## 2023-08-03 DIAGNOSIS — L700 Acne vulgaris: Secondary | ICD-10-CM | POA: Diagnosis not present

## 2023-08-03 DIAGNOSIS — L659 Nonscarring hair loss, unspecified: Secondary | ICD-10-CM | POA: Diagnosis not present

## 2023-08-09 DIAGNOSIS — M76891 Other specified enthesopathies of right lower limb, excluding foot: Secondary | ICD-10-CM | POA: Diagnosis not present

## 2023-08-10 DIAGNOSIS — M9903 Segmental and somatic dysfunction of lumbar region: Secondary | ICD-10-CM | POA: Diagnosis not present

## 2023-08-10 DIAGNOSIS — M9901 Segmental and somatic dysfunction of cervical region: Secondary | ICD-10-CM | POA: Diagnosis not present

## 2023-08-10 DIAGNOSIS — M9902 Segmental and somatic dysfunction of thoracic region: Secondary | ICD-10-CM | POA: Diagnosis not present

## 2023-08-10 DIAGNOSIS — M9906 Segmental and somatic dysfunction of lower extremity: Secondary | ICD-10-CM | POA: Diagnosis not present

## 2023-08-16 ENCOUNTER — Encounter: Payer: Self-pay | Admitting: Family

## 2023-08-16 ENCOUNTER — Telehealth: Payer: Self-pay | Admitting: Family Medicine

## 2023-08-16 ENCOUNTER — Other Ambulatory Visit (HOSPITAL_COMMUNITY)
Admission: RE | Admit: 2023-08-16 | Discharge: 2023-08-16 | Disposition: A | Discharge status: Home / Self Care | Payer: BC Managed Care – PPO | Source: Ambulatory Visit | Attending: Family | Admitting: Family

## 2023-08-16 ENCOUNTER — Ambulatory Visit (INDEPENDENT_AMBULATORY_CARE_PROVIDER_SITE_OTHER): Payer: BC Managed Care – PPO | Admitting: Podiatry

## 2023-08-16 ENCOUNTER — Ambulatory Visit (INDEPENDENT_AMBULATORY_CARE_PROVIDER_SITE_OTHER): Payer: BC Managed Care – PPO | Admitting: Family

## 2023-08-16 VITALS — BP 122/81 | HR 64 | Temp 97.7°F | Ht 74.0 in | Wt 188.4 lb

## 2023-08-16 DIAGNOSIS — L600 Ingrowing nail: Secondary | ICD-10-CM | POA: Diagnosis not present

## 2023-08-16 DIAGNOSIS — R0981 Nasal congestion: Secondary | ICD-10-CM | POA: Diagnosis not present

## 2023-08-16 DIAGNOSIS — Z113 Encounter for screening for infections with a predominantly sexual mode of transmission: Secondary | ICD-10-CM

## 2023-08-16 DIAGNOSIS — M9903 Segmental and somatic dysfunction of lumbar region: Secondary | ICD-10-CM | POA: Diagnosis not present

## 2023-08-16 DIAGNOSIS — M9904 Segmental and somatic dysfunction of sacral region: Secondary | ICD-10-CM | POA: Diagnosis not present

## 2023-08-16 DIAGNOSIS — M7918 Myalgia, other site: Secondary | ICD-10-CM | POA: Diagnosis not present

## 2023-08-16 DIAGNOSIS — M9905 Segmental and somatic dysfunction of pelvic region: Secondary | ICD-10-CM | POA: Diagnosis not present

## 2023-08-16 MED ORDER — TRIAMCINOLONE ACETONIDE 55 MCG/ACT NA AERO: 1.0000 | INHALATION_SPRAY | Freq: Every day | NASAL | 2 refills | Status: AC

## 2023-08-16 NOTE — Telephone Encounter (Signed)
Patient    would like a std panal done and blood work done  as well patient mother would like a call back regarding this he needs a sooner appt then what is available

## 2023-08-16 NOTE — Patient Instructions (Signed)

## 2023-08-16 NOTE — Progress Notes (Signed)
       Subjective:  Patient ID: Johnny Hendricks, male    DOB: [birth year],  MRN: [id number]  Johnny Hendricks presents to clinic today for:  Chief Complaint  Patient presents with   Ingrown Toenail    LFT LAT BORDER GREAT TOE, BEEN USING EPSOM SALT SOAKS, RED AND SWOLLEN. HAD DRAINAGE THE WEEK OF THANKSGIVING. 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    Patient presents with his mother today with concern of a painful ingrown toenail to the left great toe along both the medial and lateral borders.  Notes that he has been soaking the toe in Epsom salt solution.  States that there was some drainage during the week of Thanksgiving from the toe.  He plays all college baseball and will be starting with training or practice again in approximately 6 weeks, according to his mother.  PCP is Shelva Majestic, MD.  Allergies  Allergen Reactions   Erythromycin Other (See Comments)    Other Reaction(s): vomiting but mom not sure it was the med took in dec 06    Review of Systems: Negative except as noted in the HPI.  Objective:  Lloyde Dechene is a pleasant 22 y.o. male in NAD. AAO x 3.  Vascular Examination: Capillary refill time is 3-5 seconds to toes bilateral. Palpable pedal pulses b/l LE. Digital hair present b/l. No pedal edema b/l. Skin temperature gradient WNL b/l. No varicosities b/l. No cyanosis or clubbing noted b/l.   Dermatological Examination: There is incurvation of the left hallux medial and lateral nail border.  There is pain on palpation of the affected nail borders.  Evidence of recent drainage from small eschar present.  No purulence is noted.  Localized edema present.  Neurological Examination: Epicritic sensation is intact left foot  Assessment/Plan: 1. Ingrown toenail    Discussed patient's condition today.  After obtaining patient consent, the left hallux was anesthetized with a 50:50 mixture of 1% lidocaine plain and 0.5% bupivacaine plain for a total of 3cc's administered.   Upon confirmation of anesthesia, a freer elevator was utilized to free the left hallux medial and lateral nail border from the nail bed.  The nail borders were then avulsed proximal to the eponychium and removed in toto.  The area was inspected for any remaining spicules.  A chemical matrixectomy was performed with NaOH and neutralized with acetic acid solution.  Antibiotic ointment and a DSD were applied, followed by a Coban dressing.  Patient tolerated the anesthetic and procedure well and will f/u in 2-3 weeks for recheck.  Patient given post-procedure instructions for daily 15-minute Epsom salt soaks, antibiotic ointment and daily use of Bandaids until toe starts to dry / form eschar.    Return in about 2 weeks (around 08/30/2023) for PNA recheck, OR as needed.   Clerance Lav, DPM, FACFAS [facility]     [address] [place], [place] [address]                Office [phone]  Fax [phone]

## 2023-08-16 NOTE — Progress Notes (Signed)
Patient ID: Johnny Hendricks, male    DOB: 03-07-2001, 22 y.o.   MRN: [id number]  Chief Complaint  Patient presents with   STD testing     Pts ex partner cheated on him and he would like STD testing. Pt denies any sx at this time.   Sinus Problem    Pt c/o nasal congestion and headaches, Present for 3 weeks. Pt was given Amoxicillin and Z-pack which did help other sx. Pt has to use Afrin to keep head congestion away.        Discussed the use of AI scribe software for clinical note transcription with the patient, who gave verbal consent to proceed.  History of Present Illness   The patient, with a history of sinus infection and walking pneumonia, presents with persistent nasal congestion. He was previously treated with a Z-Pak and amoxicillin, which initially improved his symptoms. However, he began experiencing a recurrence of symptoms, including stuffiness and sinus pressure, last week. He has been using Afrin for relief, but notes that his congestion worsens significantly when the effects of the Afrin wear off. He also reports a significant amount of discharge when performing a nasal rinse. He denies having a runny nose outside of these instances. He also notes that his lymph nodes began to swell at the beginning of last week, but this has improved with vitamin intake. Pt also reports his partner recently cheated on him and he is wanted a full panel of STI testing done, reports having mild redness on tip of penis, but unsure if this is a side effect of starting Accutane recently.     Assessment & Plan:     Sinusitis -  Recent sinus infection and pneumonia treated with Z-Pak and amoxicillin. Current symptoms of congestion and sinus pressure. Overuse of Afrin leading to rebound congestion. -Discontinue Afrin. -Start Nasacort, one squirt each side twice a day for a few days, then once a day for a week or two. -Continue saline spray or neti pot rinses as needed. -RTO precautions provided.  STI  testing - Request for comprehensive STI screening. Pt denies any symptoms. -Order lab tests for HIV, RPR, chlamydia, gonorrhea, and trichomonas.     Subjective:    Outpatient Medications Prior to Visit  Medication Sig Dispense Refill   albuterol (PROAIR HFA) 108 (90 Base) MCG/ACT inhaler 0 Refill(s), Type: Soft Stop     etodolac (LODINE) 400 MG tablet Take by mouth.     ISOtretinoin (ACCUTANE) 40 MG capsule Take 40 mg by mouth daily.     Zinc 50 MG TABS 1 tablet Orally Once a day for 30 day(s)     No facility-administered medications prior to visit.   Past Medical History:  Diagnosis Date   Asthma    Testicle lump 08/31/2022   Past Surgical History:  Procedure Laterality Date   TONSILLECTOMY AND ADENOIDECTOMY  2010   TYMPANOSTOMY TUBE PLACEMENT  2006   Allergies  Allergen Reactions   Erythromycin Other (See Comments)    Other Reaction(s): vomiting but mom not sure it was the med took in dec 06      Objective:    Physical Exam Vitals and nursing note reviewed.  Constitutional:      General: He is not in acute distress.    Appearance: Normal appearance. He is not ill-appearing.  HENT:     Head: Normocephalic.     Right Ear: Tympanic membrane and ear canal normal.     Left Ear: Tympanic membrane and  ear canal normal.     Nose:     Right Sinus: Frontal sinus tenderness present. No maxillary sinus tenderness.     Left Sinus: Frontal sinus tenderness present. No maxillary sinus tenderness.     Mouth/Throat:     Mouth: Mucous membranes are moist.     Pharynx: No pharyngeal swelling, oropharyngeal exudate, posterior oropharyngeal erythema or uvula swelling.     Tonsils: No tonsillar exudate or tonsillar abscesses.  Cardiovascular:     Rate and Rhythm: Normal rate and regular rhythm.  Pulmonary:     Effort: Pulmonary effort is normal.     Breath sounds: Normal breath sounds.  Musculoskeletal:        General: Normal range of motion.     Cervical back: Normal range of  motion.  Lymphadenopathy:     Head:     Right side of head: No preauricular or posterior auricular adenopathy.     Left side of head: No preauricular or posterior auricular adenopathy.     Cervical: No cervical adenopathy.  Skin:    General: Skin is warm and dry.  Neurological:     Mental Status: He is alert and oriented to person, place, and time.  Psychiatric:        Mood and Affect: Mood normal.    BP 122/81 (BP Location: Left Arm, Patient Position: Sitting, Cuff Size: Large)   Pulse 64   Temp 97.7 F (36.5 C) (Temporal)   Ht 6\' 2"  (1.88 m)   Wt 188 lb 6 oz (85.4 kg)   SpO2 98%   BMI 24.19 kg/m  Wt Readings from Last 3 Encounters:  08/16/23 188 lb 6 oz (85.4 kg)  08/31/22 200 lb 3.2 oz (90.8 kg) (93%, Z= 1.44)*  02/07/22 197 lb (89.4 kg) (92%, Z= 1.42)*   * Growth percentiles are based on CDC (Boys, 2-20 Years) data.      Dulce Sellar, NP

## 2023-08-17 LAB — HIV ANTIBODY (ROUTINE TESTING W REFLEX): HIV 1&2 Ab, 4th Generation: NONREACTIVE

## 2023-08-17 LAB — RPR: RPR Ser Ql: NONREACTIVE

## 2023-08-18 LAB — URINE CYTOLOGY ANCILLARY ONLY
Chlamydia: NEGATIVE
Comment: NEGATIVE
Comment: NEGATIVE
Comment: NORMAL
Neisseria Gonorrhea: NEGATIVE
Trichomonas: NEGATIVE

## 2023-08-21 DIAGNOSIS — M9903 Segmental and somatic dysfunction of lumbar region: Secondary | ICD-10-CM | POA: Diagnosis not present

## 2023-08-21 DIAGNOSIS — M7918 Myalgia, other site: Secondary | ICD-10-CM | POA: Diagnosis not present

## 2023-08-21 DIAGNOSIS — M9905 Segmental and somatic dysfunction of pelvic region: Secondary | ICD-10-CM | POA: Diagnosis not present

## 2023-08-21 DIAGNOSIS — M9904 Segmental and somatic dysfunction of sacral region: Secondary | ICD-10-CM | POA: Diagnosis not present

## 2023-08-24 DIAGNOSIS — M7918 Myalgia, other site: Secondary | ICD-10-CM | POA: Diagnosis not present

## 2023-08-24 DIAGNOSIS — M9904 Segmental and somatic dysfunction of sacral region: Secondary | ICD-10-CM | POA: Diagnosis not present

## 2023-08-24 DIAGNOSIS — M9903 Segmental and somatic dysfunction of lumbar region: Secondary | ICD-10-CM | POA: Diagnosis not present

## 2023-08-24 DIAGNOSIS — M9905 Segmental and somatic dysfunction of pelvic region: Secondary | ICD-10-CM | POA: Diagnosis not present

## 2023-08-28 DIAGNOSIS — M9905 Segmental and somatic dysfunction of pelvic region: Secondary | ICD-10-CM | POA: Diagnosis not present

## 2023-08-28 DIAGNOSIS — M9903 Segmental and somatic dysfunction of lumbar region: Secondary | ICD-10-CM | POA: Diagnosis not present

## 2023-08-28 DIAGNOSIS — M7918 Myalgia, other site: Secondary | ICD-10-CM | POA: Diagnosis not present

## 2023-08-28 DIAGNOSIS — M9904 Segmental and somatic dysfunction of sacral region: Secondary | ICD-10-CM | POA: Diagnosis not present

## 2023-09-04 DIAGNOSIS — Z79899 Other long term (current) drug therapy: Secondary | ICD-10-CM | POA: Diagnosis not present

## 2023-09-04 DIAGNOSIS — L700 Acne vulgaris: Secondary | ICD-10-CM | POA: Diagnosis not present

## 2023-09-13 ENCOUNTER — Ambulatory Visit (INDEPENDENT_AMBULATORY_CARE_PROVIDER_SITE_OTHER): Payer: BC Managed Care – PPO | Admitting: Podiatry

## 2023-09-13 DIAGNOSIS — L600 Ingrowing nail: Secondary | ICD-10-CM

## 2023-09-13 NOTE — Progress Notes (Signed)
       Subjective:  Patient ID: Johnny Hendricks, male    DOB: 2001-02-10,  MRN: [id number]  Chief Complaint  Patient presents with   Nial Check    Ingrown check, border are scabbed and look good there is still a light red/pink around the base of the nail. Not diabetic, no anti coags   Johnny Hendricks presents to clinic today for f/u of PNA to the left hallux nail medial border  PCP is Shelva Majestic, MD.  Allergies  Allergen Reactions   Erythromycin Other (See Comments)    Other Reaction(s): vomiting but mom not sure it was the med took in dec 06    Objective:  Vascular Examination: Capillary refill time is 3-5 seconds to toes bilateral. Palpable pedal pulses b/l LE. Digital hair present b/l. No pedal edema b/l. Skin temperature gradient WNL b/l. No varicosities b/l. No cyanosis or clubbing noted b/l.   Dermatological Examination: Upon inspection of the PNA site, there are no clinical signs of infection.  No purulence, no necrosis, no malodor present.  Minimal to no erythema present.  Eschar formed along nail margin.  Minimal to no pain on palpation of area.   Assessment/Plan: 1. Ingrown toenail    Patient informed he is healing well.  As long as the drainage has stopped and eschar has formed he can stop with his postprocedure instructions.  Return if symptoms worsen or fail to improve.   Clerance Lav, DPM, FACFAS [facility]     [address] [place], [place] [address]                Office [phone]  Fax [phone]

## 2023-09-14 DIAGNOSIS — M9905 Segmental and somatic dysfunction of pelvic region: Secondary | ICD-10-CM | POA: Diagnosis not present

## 2023-09-14 DIAGNOSIS — M9903 Segmental and somatic dysfunction of lumbar region: Secondary | ICD-10-CM | POA: Diagnosis not present

## 2023-09-14 DIAGNOSIS — M7918 Myalgia, other site: Secondary | ICD-10-CM | POA: Diagnosis not present

## 2023-09-14 DIAGNOSIS — M9904 Segmental and somatic dysfunction of sacral region: Secondary | ICD-10-CM | POA: Diagnosis not present

## 2023-09-20 DIAGNOSIS — M25511 Pain in right shoulder: Secondary | ICD-10-CM | POA: Diagnosis not present

## 2023-09-25 DIAGNOSIS — M25511 Pain in right shoulder: Secondary | ICD-10-CM | POA: Diagnosis not present

## 2023-09-28 DIAGNOSIS — M25511 Pain in right shoulder: Secondary | ICD-10-CM | POA: Diagnosis not present

## 2023-10-02 DIAGNOSIS — M25511 Pain in right shoulder: Secondary | ICD-10-CM | POA: Diagnosis not present

## 2023-10-06 DIAGNOSIS — M25511 Pain in right shoulder: Secondary | ICD-10-CM | POA: Diagnosis not present

## 2023-10-09 DIAGNOSIS — L700 Acne vulgaris: Secondary | ICD-10-CM | POA: Diagnosis not present

## 2023-10-09 DIAGNOSIS — Z79899 Other long term (current) drug therapy: Secondary | ICD-10-CM | POA: Diagnosis not present

## 2023-10-10 DIAGNOSIS — M25511 Pain in right shoulder: Secondary | ICD-10-CM | POA: Diagnosis not present

## 2023-10-12 DIAGNOSIS — M25511 Pain in right shoulder: Secondary | ICD-10-CM | POA: Diagnosis not present

## 2023-10-16 DIAGNOSIS — M25511 Pain in right shoulder: Secondary | ICD-10-CM | POA: Diagnosis not present

## 2023-10-19 DIAGNOSIS — M25511 Pain in right shoulder: Secondary | ICD-10-CM | POA: Diagnosis not present

## 2023-10-23 DIAGNOSIS — M25511 Pain in right shoulder: Secondary | ICD-10-CM | POA: Diagnosis not present

## 2023-10-27 DIAGNOSIS — M25511 Pain in right shoulder: Secondary | ICD-10-CM | POA: Diagnosis not present

## 2023-10-30 DIAGNOSIS — M25511 Pain in right shoulder: Secondary | ICD-10-CM | POA: Diagnosis not present

## 2023-11-08 DIAGNOSIS — M25511 Pain in right shoulder: Secondary | ICD-10-CM | POA: Diagnosis not present

## 2023-11-14 DIAGNOSIS — Z79899 Other long term (current) drug therapy: Secondary | ICD-10-CM | POA: Diagnosis not present

## 2023-11-14 DIAGNOSIS — L700 Acne vulgaris: Secondary | ICD-10-CM | POA: Diagnosis not present

## 2023-11-17 DIAGNOSIS — M25511 Pain in right shoulder: Secondary | ICD-10-CM | POA: Diagnosis not present

## 2023-11-22 DIAGNOSIS — M25511 Pain in right shoulder: Secondary | ICD-10-CM | POA: Diagnosis not present

## 2023-11-28 DIAGNOSIS — M25511 Pain in right shoulder: Secondary | ICD-10-CM | POA: Diagnosis not present

## 2023-11-30 DIAGNOSIS — S43431D Superior glenoid labrum lesion of right shoulder, subsequent encounter: Secondary | ICD-10-CM | POA: Diagnosis not present

## 2023-12-05 DIAGNOSIS — S43431D Superior glenoid labrum lesion of right shoulder, subsequent encounter: Secondary | ICD-10-CM | POA: Diagnosis not present

## 2023-12-19 DIAGNOSIS — Z79899 Other long term (current) drug therapy: Secondary | ICD-10-CM | POA: Diagnosis not present

## 2023-12-19 DIAGNOSIS — L700 Acne vulgaris: Secondary | ICD-10-CM | POA: Diagnosis not present

## 2023-12-22 DIAGNOSIS — M25511 Pain in right shoulder: Secondary | ICD-10-CM | POA: Diagnosis not present

## 2023-12-25 DIAGNOSIS — M25511 Pain in right shoulder: Secondary | ICD-10-CM | POA: Diagnosis not present

## 2023-12-29 DIAGNOSIS — M25511 Pain in right shoulder: Secondary | ICD-10-CM | POA: Diagnosis not present

## 2024-01-02 DIAGNOSIS — M25511 Pain in right shoulder: Secondary | ICD-10-CM | POA: Diagnosis not present

## 2024-01-05 DIAGNOSIS — M25511 Pain in right shoulder: Secondary | ICD-10-CM | POA: Diagnosis not present

## 2024-01-15 DIAGNOSIS — M25511 Pain in right shoulder: Secondary | ICD-10-CM | POA: Diagnosis not present

## 2024-01-18 DIAGNOSIS — L700 Acne vulgaris: Secondary | ICD-10-CM | POA: Diagnosis not present

## 2024-01-18 DIAGNOSIS — Z79899 Other long term (current) drug therapy: Secondary | ICD-10-CM | POA: Diagnosis not present

## 2024-01-18 DIAGNOSIS — L810 Postinflammatory hyperpigmentation: Secondary | ICD-10-CM | POA: Diagnosis not present

## 2024-01-24 DIAGNOSIS — M25511 Pain in right shoulder: Secondary | ICD-10-CM | POA: Diagnosis not present

## 2024-01-25 DIAGNOSIS — M7541 Impingement syndrome of right shoulder: Secondary | ICD-10-CM | POA: Diagnosis not present

## 2024-01-25 DIAGNOSIS — M9902 Segmental and somatic dysfunction of thoracic region: Secondary | ICD-10-CM | POA: Diagnosis not present

## 2024-01-25 DIAGNOSIS — M9901 Segmental and somatic dysfunction of cervical region: Secondary | ICD-10-CM | POA: Diagnosis not present

## 2024-01-25 DIAGNOSIS — M7551 Bursitis of right shoulder: Secondary | ICD-10-CM | POA: Diagnosis not present

## 2024-01-25 DIAGNOSIS — M9907 Segmental and somatic dysfunction of upper extremity: Secondary | ICD-10-CM | POA: Diagnosis not present

## 2024-01-26 DIAGNOSIS — M25511 Pain in right shoulder: Secondary | ICD-10-CM | POA: Diagnosis not present

## 2024-01-31 DIAGNOSIS — S43431A Superior glenoid labrum lesion of right shoulder, initial encounter: Secondary | ICD-10-CM | POA: Diagnosis not present

## 2024-02-01 DIAGNOSIS — M9901 Segmental and somatic dysfunction of cervical region: Secondary | ICD-10-CM | POA: Diagnosis not present

## 2024-02-01 DIAGNOSIS — M7551 Bursitis of right shoulder: Secondary | ICD-10-CM | POA: Diagnosis not present

## 2024-02-01 DIAGNOSIS — M9902 Segmental and somatic dysfunction of thoracic region: Secondary | ICD-10-CM | POA: Diagnosis not present

## 2024-02-01 DIAGNOSIS — M7541 Impingement syndrome of right shoulder: Secondary | ICD-10-CM | POA: Diagnosis not present

## 2024-02-01 DIAGNOSIS — M9907 Segmental and somatic dysfunction of upper extremity: Secondary | ICD-10-CM | POA: Diagnosis not present

## 2024-02-13 DIAGNOSIS — M9907 Segmental and somatic dysfunction of upper extremity: Secondary | ICD-10-CM | POA: Diagnosis not present

## 2024-02-13 DIAGNOSIS — M7551 Bursitis of right shoulder: Secondary | ICD-10-CM | POA: Diagnosis not present

## 2024-02-13 DIAGNOSIS — M9902 Segmental and somatic dysfunction of thoracic region: Secondary | ICD-10-CM | POA: Diagnosis not present

## 2024-02-13 DIAGNOSIS — M7541 Impingement syndrome of right shoulder: Secondary | ICD-10-CM | POA: Diagnosis not present

## 2024-02-13 DIAGNOSIS — M9901 Segmental and somatic dysfunction of cervical region: Secondary | ICD-10-CM | POA: Diagnosis not present

## 2024-02-20 DIAGNOSIS — M7551 Bursitis of right shoulder: Secondary | ICD-10-CM | POA: Diagnosis not present

## 2024-02-20 DIAGNOSIS — M9907 Segmental and somatic dysfunction of upper extremity: Secondary | ICD-10-CM | POA: Diagnosis not present

## 2024-02-20 DIAGNOSIS — M7541 Impingement syndrome of right shoulder: Secondary | ICD-10-CM | POA: Diagnosis not present

## 2024-02-20 DIAGNOSIS — M9902 Segmental and somatic dysfunction of thoracic region: Secondary | ICD-10-CM | POA: Diagnosis not present

## 2024-02-20 DIAGNOSIS — M25511 Pain in right shoulder: Secondary | ICD-10-CM | POA: Diagnosis not present

## 2024-02-20 DIAGNOSIS — M9901 Segmental and somatic dysfunction of cervical region: Secondary | ICD-10-CM | POA: Diagnosis not present

## 2024-03-02 DIAGNOSIS — H6092 Unspecified otitis externa, left ear: Secondary | ICD-10-CM | POA: Diagnosis not present

## 2024-03-02 DIAGNOSIS — H6692 Otitis media, unspecified, left ear: Secondary | ICD-10-CM | POA: Diagnosis not present

## 2024-03-08 DIAGNOSIS — M7551 Bursitis of right shoulder: Secondary | ICD-10-CM | POA: Diagnosis not present

## 2024-03-08 DIAGNOSIS — M9902 Segmental and somatic dysfunction of thoracic region: Secondary | ICD-10-CM | POA: Diagnosis not present

## 2024-03-08 DIAGNOSIS — M9901 Segmental and somatic dysfunction of cervical region: Secondary | ICD-10-CM | POA: Diagnosis not present

## 2024-03-08 DIAGNOSIS — M7541 Impingement syndrome of right shoulder: Secondary | ICD-10-CM | POA: Diagnosis not present

## 2024-03-08 DIAGNOSIS — M9907 Segmental and somatic dysfunction of upper extremity: Secondary | ICD-10-CM | POA: Diagnosis not present

## 2024-03-12 DIAGNOSIS — M9902 Segmental and somatic dysfunction of thoracic region: Secondary | ICD-10-CM | POA: Diagnosis not present

## 2024-03-12 DIAGNOSIS — M9907 Segmental and somatic dysfunction of upper extremity: Secondary | ICD-10-CM | POA: Diagnosis not present

## 2024-03-12 DIAGNOSIS — M7541 Impingement syndrome of right shoulder: Secondary | ICD-10-CM | POA: Diagnosis not present

## 2024-03-12 DIAGNOSIS — M9901 Segmental and somatic dysfunction of cervical region: Secondary | ICD-10-CM | POA: Diagnosis not present

## 2024-03-12 DIAGNOSIS — M7551 Bursitis of right shoulder: Secondary | ICD-10-CM | POA: Diagnosis not present

## 2024-03-21 DIAGNOSIS — M9902 Segmental and somatic dysfunction of thoracic region: Secondary | ICD-10-CM | POA: Diagnosis not present

## 2024-03-21 DIAGNOSIS — M9907 Segmental and somatic dysfunction of upper extremity: Secondary | ICD-10-CM | POA: Diagnosis not present

## 2024-03-21 DIAGNOSIS — M7551 Bursitis of right shoulder: Secondary | ICD-10-CM | POA: Diagnosis not present

## 2024-03-21 DIAGNOSIS — M9901 Segmental and somatic dysfunction of cervical region: Secondary | ICD-10-CM | POA: Diagnosis not present

## 2024-03-21 DIAGNOSIS — M7541 Impingement syndrome of right shoulder: Secondary | ICD-10-CM | POA: Diagnosis not present

## 2024-04-03 DIAGNOSIS — M9902 Segmental and somatic dysfunction of thoracic region: Secondary | ICD-10-CM | POA: Diagnosis not present

## 2024-04-03 DIAGNOSIS — M9907 Segmental and somatic dysfunction of upper extremity: Secondary | ICD-10-CM | POA: Diagnosis not present

## 2024-04-03 DIAGNOSIS — M7541 Impingement syndrome of right shoulder: Secondary | ICD-10-CM | POA: Diagnosis not present

## 2024-04-03 DIAGNOSIS — M9901 Segmental and somatic dysfunction of cervical region: Secondary | ICD-10-CM | POA: Diagnosis not present

## 2024-04-03 DIAGNOSIS — M7551 Bursitis of right shoulder: Secondary | ICD-10-CM | POA: Diagnosis not present

## 2024-04-08 DIAGNOSIS — M7541 Impingement syndrome of right shoulder: Secondary | ICD-10-CM | POA: Diagnosis not present

## 2024-04-08 DIAGNOSIS — M9901 Segmental and somatic dysfunction of cervical region: Secondary | ICD-10-CM | POA: Diagnosis not present

## 2024-04-08 DIAGNOSIS — M9907 Segmental and somatic dysfunction of upper extremity: Secondary | ICD-10-CM | POA: Diagnosis not present

## 2024-04-08 DIAGNOSIS — M7551 Bursitis of right shoulder: Secondary | ICD-10-CM | POA: Diagnosis not present

## 2024-04-08 DIAGNOSIS — M9902 Segmental and somatic dysfunction of thoracic region: Secondary | ICD-10-CM | POA: Diagnosis not present

## 2024-04-16 DIAGNOSIS — M9907 Segmental and somatic dysfunction of upper extremity: Secondary | ICD-10-CM | POA: Diagnosis not present

## 2024-04-16 DIAGNOSIS — M7551 Bursitis of right shoulder: Secondary | ICD-10-CM | POA: Diagnosis not present

## 2024-04-16 DIAGNOSIS — M9902 Segmental and somatic dysfunction of thoracic region: Secondary | ICD-10-CM | POA: Diagnosis not present

## 2024-04-16 DIAGNOSIS — M9901 Segmental and somatic dysfunction of cervical region: Secondary | ICD-10-CM | POA: Diagnosis not present

## 2024-04-16 DIAGNOSIS — M7541 Impingement syndrome of right shoulder: Secondary | ICD-10-CM | POA: Diagnosis not present

## 2024-04-19 DIAGNOSIS — M7551 Bursitis of right shoulder: Secondary | ICD-10-CM | POA: Diagnosis not present

## 2024-04-19 DIAGNOSIS — M9907 Segmental and somatic dysfunction of upper extremity: Secondary | ICD-10-CM | POA: Diagnosis not present

## 2024-04-19 DIAGNOSIS — M7541 Impingement syndrome of right shoulder: Secondary | ICD-10-CM | POA: Diagnosis not present

## 2024-04-19 DIAGNOSIS — M9901 Segmental and somatic dysfunction of cervical region: Secondary | ICD-10-CM | POA: Diagnosis not present

## 2024-04-19 DIAGNOSIS — M9902 Segmental and somatic dysfunction of thoracic region: Secondary | ICD-10-CM | POA: Diagnosis not present

## 2024-04-24 DIAGNOSIS — J069 Acute upper respiratory infection, unspecified: Secondary | ICD-10-CM | POA: Diagnosis not present
